# Patient Record
Sex: Male | Born: 2000 | Race: Black or African American | Hispanic: No | Marital: Single | Smoking: Never smoker
Health system: Southern US, Community
[De-identification: ages and names within clinical notes are randomized; demographics above are authoritative.]

---
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
## 2019-10-19 ENCOUNTER — Ambulatory Visit: Payer: Medicaid Other | Attending: Family

## 2019-10-19 DIAGNOSIS — Z23 Encounter for immunization: Secondary | ICD-10-CM

## 2019-10-19 NOTE — Progress Notes (Signed)
   Covid-19 Vaccination Clinic  Name:  Ian Beasley    MRN: [id number] DOB: 2001/06/01  10/19/2019  [name] was observed post Covid-19 immunization for 15 minutes without incident. He was provided with Vaccine Information Sheet and instruction to access the V-Safe system.   [name] was instructed to call 911 with any severe reactions post vaccine: Marland Kitchen Difficulty breathing  . Swelling of face and throat  . A fast heartbeat  . A bad rash all over body  . Dizziness and weakness   Immunizations Administered    Name Date Dose VIS Date Route   Pfizer COVID-19 Vaccine 10/19/2019  1:24 PM 0.3 mL 07/26/2018 Intramuscular   Manufacturer: [employer], [employer]   Lot: [id number]   NDC: [address]-4

## 2020-08-13 ENCOUNTER — Encounter (HOSPITAL_COMMUNITY): Payer: Self-pay | Admitting: *Deleted

## 2020-08-13 ENCOUNTER — Ambulatory Visit (HOSPITAL_COMMUNITY)
Admission: EM | Admit: 2020-08-13 | Discharge: 2020-08-13 | Disposition: A | Discharge status: Home / Self Care | Payer: 59 | Attending: Urgent Care | Admitting: Urgent Care

## 2020-08-13 ENCOUNTER — Other Ambulatory Visit: Payer: Self-pay

## 2020-08-13 DIAGNOSIS — Z20822 Contact with and (suspected) exposure to covid-19: Secondary | ICD-10-CM | POA: Insufficient documentation

## 2020-08-13 DIAGNOSIS — R112 Nausea with vomiting, unspecified: Secondary | ICD-10-CM | POA: Diagnosis present

## 2020-08-13 DIAGNOSIS — R197 Diarrhea, unspecified: Secondary | ICD-10-CM

## 2020-08-13 DIAGNOSIS — K529 Noninfective gastroenteritis and colitis, unspecified: Secondary | ICD-10-CM

## 2020-08-13 LAB — SARS CORONAVIRUS 2 (TAT 6-24 HRS): SARS Coronavirus 2: NEGATIVE

## 2020-08-13 MED ORDER — LOPERAMIDE HCL 2 MG PO CAPS: 2.0000 mg | ORAL_CAPSULE | Freq: Two times a day (BID) | ORAL | 0 refills | Status: DC | PRN

## 2020-08-13 MED ORDER — ONDANSETRON 8 MG PO TBDP: 8.0000 mg | ORAL_TABLET | Freq: Three times a day (TID) | ORAL | 0 refills | Status: DC | PRN

## 2020-08-13 NOTE — Discharge Instructions (Signed)

## 2020-08-13 NOTE — ED Provider Notes (Signed)
  [facility]   MRN: [id number] DOB: 10-07-2000  Subjective:   Jolly Bleicher is a 20 y.o. male presenting for 4-day history of acute onset nausea, vomiting, diarrhea, generalized belly pain.  The vomiting has subsided but he still has 3-4 loose stools a day.  Denies fever, cough, chest pain, shortness of breath, bloody stools.  No recent hospitalizations, antibiotic use or long distance travel.  Patient is kept dietary routine the same.  No history of GI issues or family history of the same.  He has used Pepto-Bismol with minimal relief.  No current facility-administered medications for this encounter. No current outpatient medications on file.   No Known Allergies  History reviewed. No pertinent past medical history.   History reviewed. No pertinent surgical history.  History reviewed. No pertinent family history.  Social History   Tobacco Use  . Smoking status: Never Smoker  . Smokeless tobacco: Never Used    ROS   Objective:   Vitals: BP (!) 143/73 (BP Location: Right Arm)   Pulse (!) 50   Temp 98.1 F (36.7 C) (Oral)   Resp 18   SpO2 100%   Physical Exam Constitutional:      General: He is not in acute distress.    Appearance: Normal appearance. He is well-developed. He is not ill-appearing, toxic-appearing or diaphoretic.  HENT:     Head: Normocephalic and atraumatic.     Right Ear: External ear normal.     Left Ear: External ear normal.     Nose: Nose normal.     Mouth/Throat:     Mouth: Mucous membranes are moist.     Pharynx: Oropharynx is clear.  Eyes:     General: No scleral icterus.    Extraocular Movements: Extraocular movements intact.     Pupils: Pupils are equal, round, and reactive to light.  Cardiovascular:     Rate and Rhythm: Normal rate and regular rhythm.     Heart sounds: Normal heart sounds. No murmur heard. No friction rub. No gallop.   Pulmonary:     Effort: Pulmonary effort is normal. No respiratory distress.      Breath sounds: Normal breath sounds. No stridor. No wheezing, rhonchi or rales.  Abdominal:     General: Bowel sounds are normal. There is no distension.     Palpations: Abdomen is soft. There is no mass.     Tenderness: There is generalized abdominal tenderness (mild). There is no guarding or rebound.  Skin:    General: Skin is warm and dry.  Neurological:     Mental Status: He is alert and oriented to person, place, and time.  Psychiatric:        Mood and Affect: Mood normal.        Behavior: Behavior normal.        Thought Content: Thought content normal.     Assessment and Plan :   PDMP not reviewed this encounter.  1. Gastroenteritis   2. Nausea vomiting and diarrhea     Will manage for suspected viral gastroenteritis with supportive care.  Recommended patient hydrate well, eat light meals and maintain electrolytes.  Will use Zofran and Imodium for nausea, vomiting and diarrhea. Counseled patient on potential for adverse effects with medications prescribed/recommended today, ER and return-to-clinic precautions discussed, patient verbalized understanding.    Wallis Bamberg, [place] 08/13/20 1405

## 2020-08-13 NOTE — ED Triage Notes (Signed)
Pt reports a onset of ABD pain ,N/V and diarrhea that started SAt night. Pt reports no improvement and is not eating as normal.

## 2020-09-24 IMAGING — CR DG ANKLE COMPLETE 3+V*L*
3 series · 3 of 3 positions shown · non-contrast
Comparison: None.

CLINICAL DATA: Soccer injury with persistent pain

EXAM:
LEFT ANKLE COMPLETE - 3+ VIEW

[x ankle ap left]
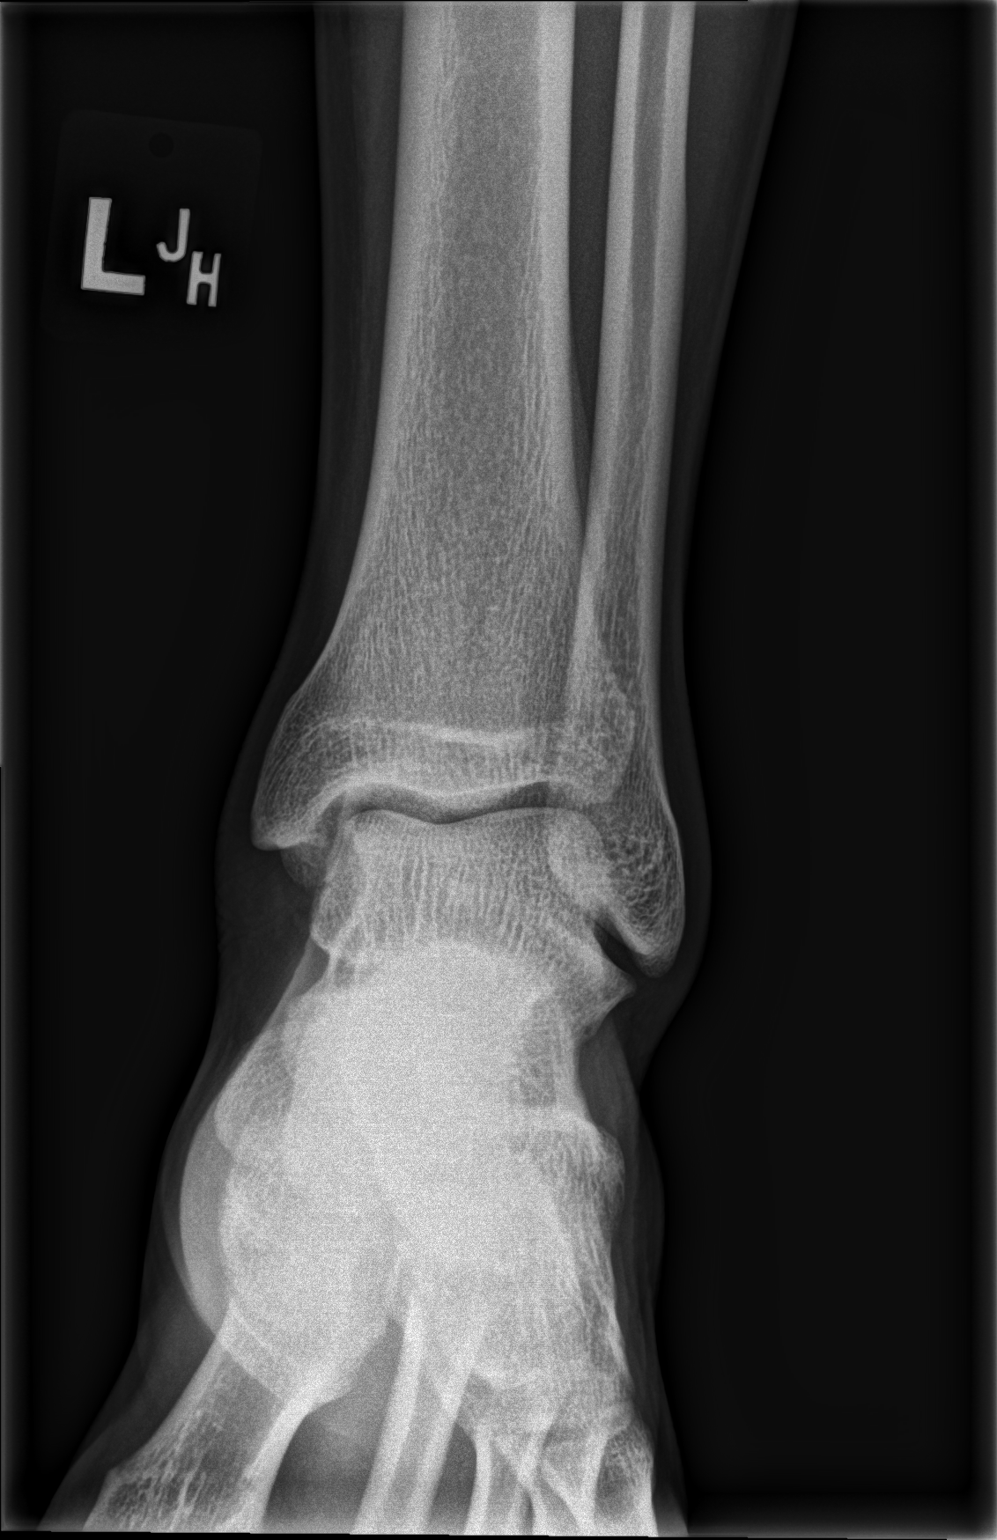

[x ankle obl left]
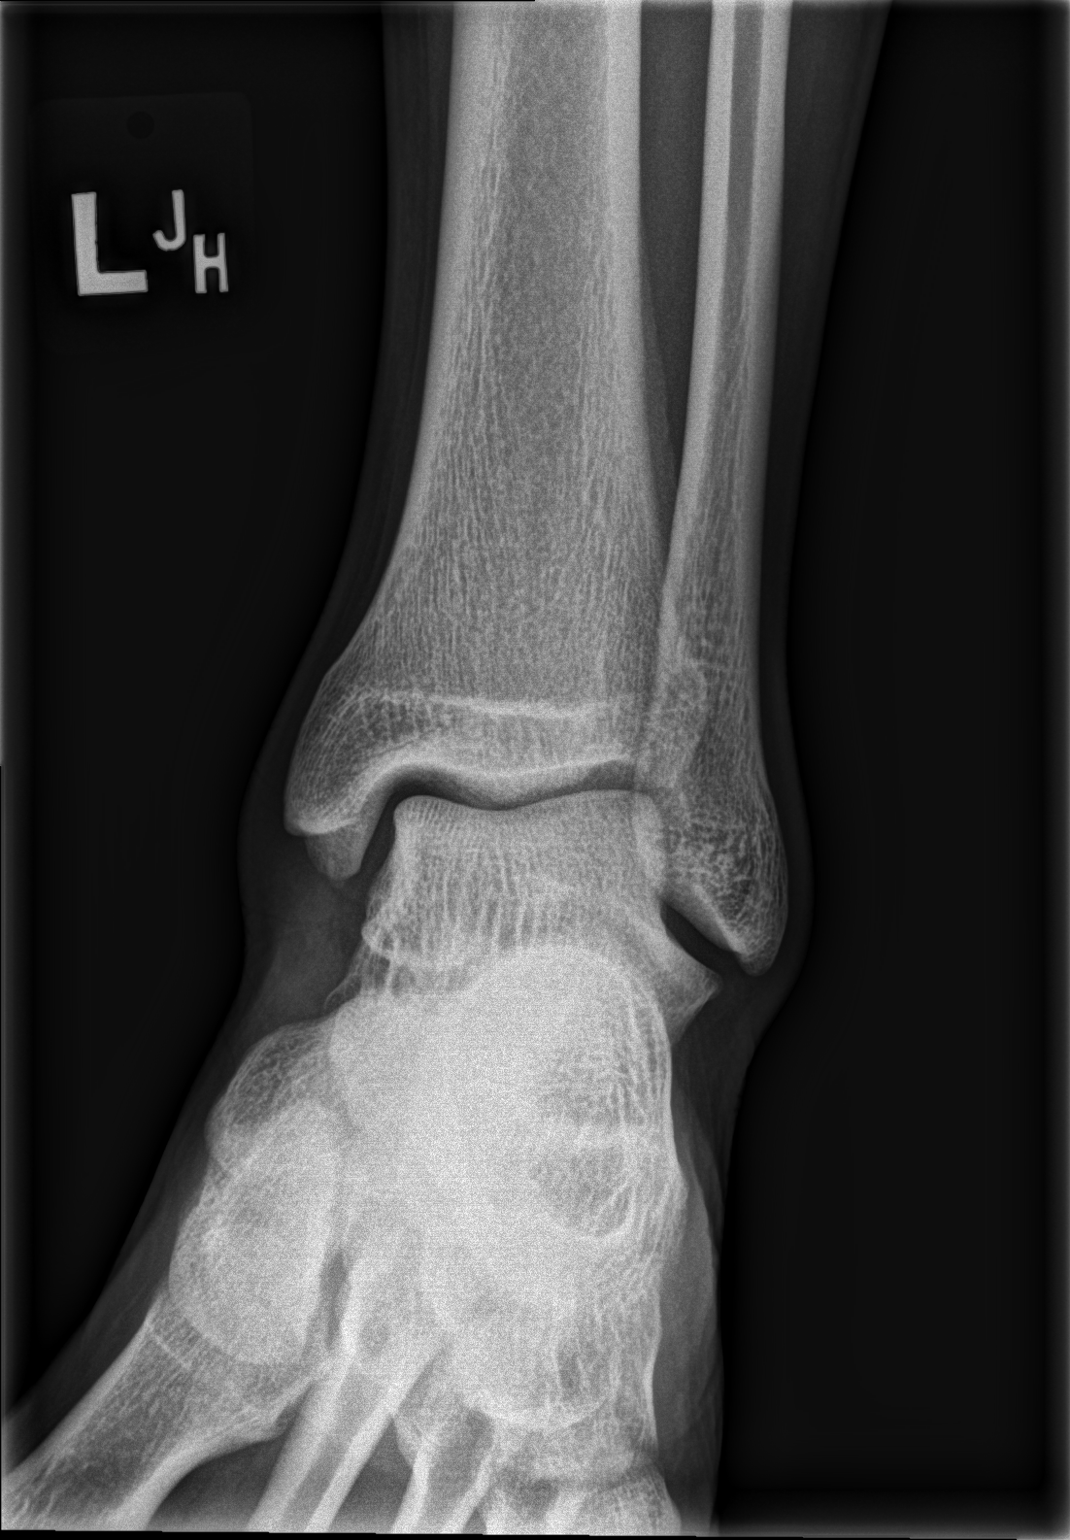

[x ankle lat left]
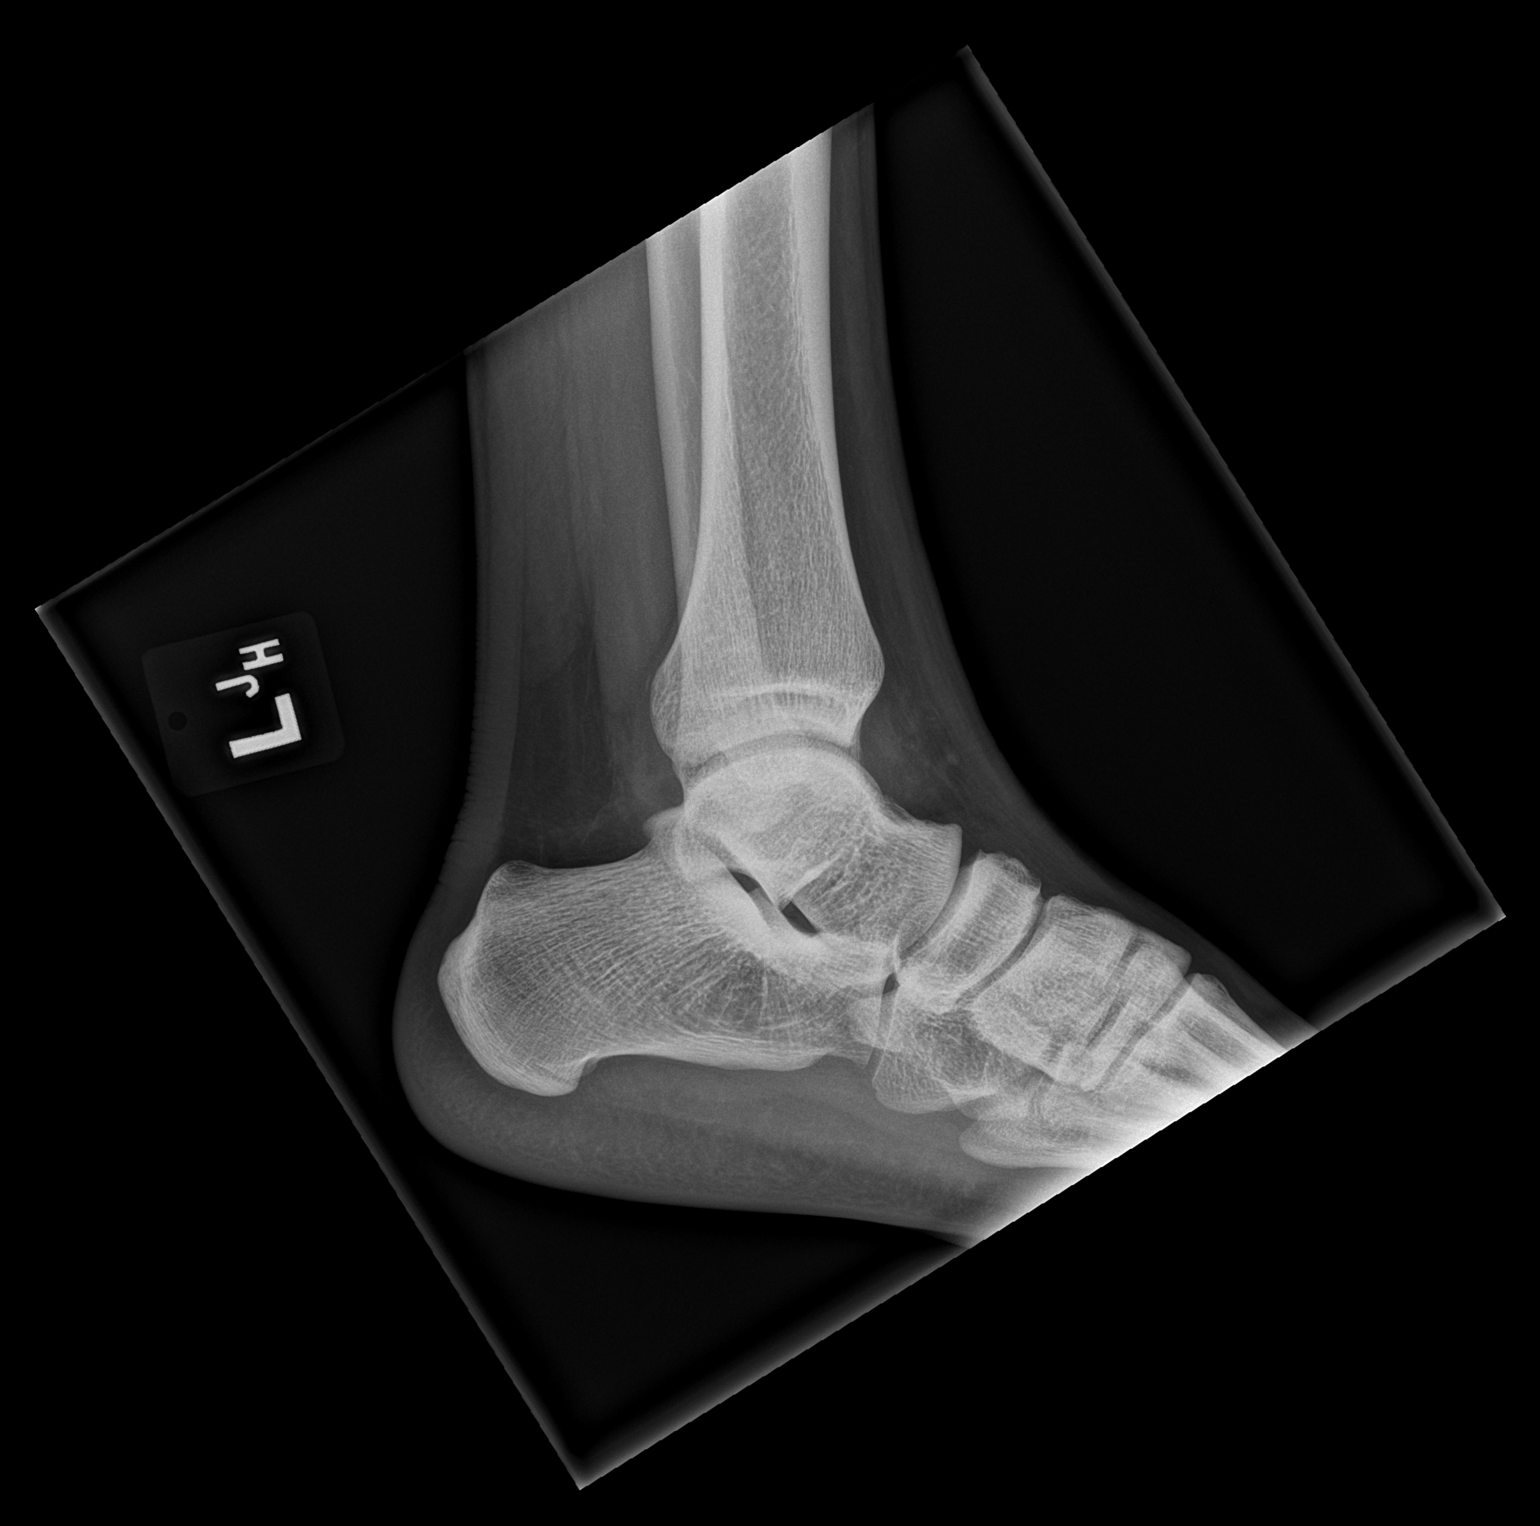

[3 of 3 positions shown; findings below may reference images not displayed]

FINDINGS: There is no evidence of fracture, dislocation, or joint effusion.
There is no evidence of arthropathy or other focal bone abnormality.
Soft tissues are unremarkable.
IMPRESSION: Negative.

## 2024-01-09 ENCOUNTER — Ambulatory Visit (HOSPITAL_COMMUNITY)
Admission: EM | Admit: 2024-01-09 | Discharge: 2024-01-09 | Disposition: A | Discharge status: Home / Self Care | Attending: Physician Assistant | Admitting: Physician Assistant

## 2024-01-09 ENCOUNTER — Ambulatory Visit (HOSPITAL_COMMUNITY)

## 2024-01-09 ENCOUNTER — Encounter (HOSPITAL_COMMUNITY): Payer: Self-pay

## 2024-01-09 ENCOUNTER — Ambulatory Visit (INDEPENDENT_AMBULATORY_CARE_PROVIDER_SITE_OTHER)

## 2024-01-09 DIAGNOSIS — M25462 Effusion, left knee: Secondary | ICD-10-CM | POA: Diagnosis not present

## 2024-01-09 DIAGNOSIS — M25571 Pain in right ankle and joints of right foot: Secondary | ICD-10-CM

## 2024-01-09 DIAGNOSIS — M25562 Pain in left knee: Secondary | ICD-10-CM

## 2024-01-09 DIAGNOSIS — S93401A Sprain of unspecified ligament of right ankle, initial encounter: Secondary | ICD-10-CM | POA: Diagnosis not present

## 2024-01-09 DIAGNOSIS — S8392XA Sprain of unspecified site of left knee, initial encounter: Secondary | ICD-10-CM

## 2024-01-09 MED ORDER — CELECOXIB 100 MG PO CAPS: 100.0000 mg | ORAL_CAPSULE | Freq: Two times a day (BID) | ORAL | 0 refills | Status: AC

## 2024-01-09 NOTE — ED Triage Notes (Signed)
 Pt states that he fell and injured is left knee. X1 day

## 2024-01-09 NOTE — ED Provider Notes (Signed)
 [facility]    CSN: [id number] Arrival date & time: 01/09/24  1025      History   Chief Complaint Chief Complaint  Patient presents with   Knee Injury    HPI Ian Beasley is a 23 y.o. male.   Patient presents today for evaluation of left ankle and right knee pain.  He reports that last week he rolled his left ankle several times and had significant pain and swelling.  He is sprained his ankle in the past and assumed that it would resolve with conservative treatment measures including RICE protocol but has continued to have pain prompting evaluation.  He reports that this pain is minimal but worse with ambulation.  He has never had any surgery involving his ankle.  While he would like to have this evaluated his primary concern is severe right knee pain.  Reports that yesterday when he was playing soccer he fell in such a way that there was significant varus stress on the knee.  He was initially able to ambulate after the injury but has had worsening pain since then and is having difficulty today.  He reports minimal pain at rest but it is rated 9 with attempted ambulation, generalized throughout the knee, no alleviating factors identified.  Denies previous injury or surgery involving his knee.  He has not tried any over-the-counter medication for symptom management but has been using a brace with minimal improvement.    History reviewed. No pertinent past medical history.  There are no active problems to display for this patient.   History reviewed. No pertinent surgical history.     Home Medications    Prior to Admission medications   Medication Sig Start Date End Date Taking? Authorizing Provider  celecoxib  (CELEBREX ) 100 MG capsule Take 1 capsule (100 mg total) by mouth 2 (two) times daily. 01/09/24  Yes Isola Mehlman, Rocky POUR, PA-C    Family History History reviewed. No pertinent family history.  Social History Social History   Tobacco Use   Smoking status: Never    Smokeless tobacco: Never  Vaping Use   Vaping status: Never Used  Substance Use Topics   Alcohol use: Never   Drug use: Never     Allergies   Patient has no known allergies.   Review of Systems Review of Systems  Constitutional:  Positive for activity change. Negative for appetite change, fatigue and fever.  Musculoskeletal:  Positive for arthralgias, gait problem, joint swelling and myalgias.  Skin:  Negative for color change and wound.  Neurological:  Negative for weakness and numbness.     Physical Exam Triage Vital Signs ED Triage Vitals  Encounter Vitals Group     BP 01/09/24 1040 130/77     Girls Systolic BP Percentile --      Girls Diastolic BP Percentile --      Boys Systolic BP Percentile --      Boys Diastolic BP Percentile --      Pulse Rate 01/09/24 1040 (!) 58     Resp 01/09/24 1040 19     Temp 01/09/24 1040 98.5 F (36.9 C)     Temp Source 01/09/24 1040 Oral     SpO2 01/09/24 1040 97 %     Weight 01/09/24 1038 230 lb (104.3 kg)     Height 01/09/24 1038 6' 4 (1.93 m)     Head Circumference --      Peak Flow --      Pain Score 01/09/24 1038 9  Pain Loc --      Pain Education --      Exclude from Growth Chart --    No data found.  Updated Vital Signs BP 130/77 (BP Location: Left Arm)   Pulse (!) 58   Temp 98.5 F (36.9 C) (Oral)   Resp 19   Ht 6' 4 (1.93 m)   Wt 230 lb (104.3 kg)   SpO2 97%   BMI 28.00 kg/m   Visual Acuity Right Eye Distance:   Left Eye Distance:   Bilateral Distance:    Right Eye Near:   Left Eye Near:    Bilateral Near:     Physical Exam Vitals reviewed.  Constitutional:      General: He is awake.     Appearance: Normal appearance. He is well-developed. He is not ill-appearing.     Comments: Very pleasant male appears stated age in no acute distress sitting comfortably in exam room  HENT:     Head: Normocephalic and atraumatic.  Cardiovascular:     Rate and Rhythm: Normal rate and regular rhythm.      Heart sounds: Normal heart sounds, S1 normal and S2 normal. No murmur heard.    Comments: Capillary refill within 2 seconds bilateral toes Pulmonary:     Effort: Pulmonary effort is normal.     Breath sounds: Normal breath sounds. No stridor. No wheezing, rhonchi or rales.     Comments: Clear to auscultation bilaterally Musculoskeletal:     Right knee: Swelling and effusion present. Decreased range of motion. Tenderness present over the medial joint line and lateral joint line.     Left ankle: Swelling present. Tenderness present over the lateral malleolus. Normal range of motion.     Comments: Right knee: Significant swelling and moderate effusion with tenderness over inferior joint line worse over  medial tibial plateau.  No ligamentous laxity on exam though exam is significantly limited due to pain and swelling.  Foot is neurovascularly intact.  Left ankle: Tenderness palpation over lateral malleolus without deformity.  Normal active range of motion with inversion, eversion, plantar and dorsiflexion.  Foot is neurovascularly intact.  Neurological:     Mental Status: He is alert.  Psychiatric:        Behavior: Behavior is cooperative.      UC Treatments / Results  Labs (all labs ordered are listed, but only abnormal results are displayed) Labs Reviewed - No data to display  EKG   Radiology DG Ankle Complete Right Result Date: 01/09/2024 CLINICAL DATA:  Pain after injury from soccer game EXAM: RIGHT ANKLE - COMPLETE 3+ VIEW COMPARISON:  None Available. FINDINGS: No acute fracture or dislocation. Swelling over the lateral malleolus. IMPRESSION: No acute fracture or dislocation. Electronically Signed   By: Norman Gatlin M.D.   On: 01/09/2024 11:30   DG Knee Complete 4 Views Left Result Date: 01/09/2024 CLINICAL DATA:  Injury during a soccer game.  Pain. EXAM: LEFT KNEE - COMPLETE 4+ VIEW COMPARISON:  None Available. FINDINGS: No evidence of acute fracture or dislocation. Small knee  joint effusion. Soft tissues are unremarkable. IMPRESSION: No acute fracture or dislocation. Small knee joint effusion. Electronically Signed   By: Norman Gatlin M.D.   On: 01/09/2024 11:30    Procedures Procedures (including critical care time)  Medications Ordered in UC Medications - No data to display  Initial Impression / Assessment and Plan / UC Course  I have reviewed the triage vital signs and the nursing notes.  Pertinent labs &  imaging results that were available during my care of the patient were reviewed by me and considered in my medical decision making (see chart for details).     Patient is well-appearing, afebrile, nontoxic, nontachycardic.  X-ray of right ankle was obtained based on [place] ankle rules that showed no acute osseous abnormality we discussed that this is likely a sprain.  Recommended RICE protocol to help manage his symptoms that he declined brace.  X-ray of knee showed no evidence of fracture or dislocation but I am very concerned about a ligamentous injury.  Unable to identify based on physical exam as patient has significant swelling and was unable to tolerate the exam.  He was placed in the immobilizer and given crutches with instruction not to bear weight until he is seen by orthopedic provider.  He is visiting family in this area but lives in [place] and so was given the contact information for a clinic in his home area.  He was started on Celebrex  to help with pain and inflammation.  Discussed that he is not to take additional NSAIDs with this medicine.  If he has any worsening or changing symptoms he is to be seen immediately.  All questions were answered to patient satisfaction.  Final Clinical Impressions(s) / UC Diagnoses   Final diagnoses:  Acute pain of left knee  Acute right ankle pain  Sprain of right ankle, unspecified ligament, initial encounter  Effusion, left knee  Sprain of left knee, unspecified ligament, initial encounter      Discharge Instructions      Your x-rays were normal with no evidence of broken or out of place bones.  This makes me more concerned about an injury to one of the ligaments in your knee and likely a sprain of your ankle.  Keep with your ankle and your knee elevated and apply ice for 15 minutes at a time 3-4 times a day.  Use the knee immobilizer and crutches until you see the orthopedic doctor.  They should be able to do additional testing to help ensure that we are not missing something.  Call them to schedule an appointment as soon as possible.  Take Celebrex  for pain relief.  Do not take NSAIDs with this medication including aspirin, ibuprofen/Advil, naproxen/Aleve.  Use Tylenol/acetaminophen for additional pain relief.  If anything worsens or changes and you have swelling of your leg, numbness or tingling in your toes, discoloration of your leg you need to be seen immediately.     ED Prescriptions     Medication Sig Dispense Auth. Provider   celecoxib  (CELEBREX ) 100 MG capsule Take 1 capsule (100 mg total) by mouth 2 (two) times daily. 20 capsule Kemari Narez K, PA-C      PDMP not reviewed this encounter.   Sherrell Rocky POUR, PA-C 01/09/24 1202

## 2024-01-09 NOTE — Discharge Instructions (Addendum)
 Your x-rays were normal with no evidence of broken or out of place bones.  This makes me more concerned about an injury to one of the ligaments in your knee and likely a sprain of your ankle.  Keep with your ankle and your knee elevated and apply ice for 15 minutes at a time 3-4 times a day.  Use the knee immobilizer and crutches until you see the orthopedic doctor.  They should be able to do additional testing to help ensure that we are not missing something.  Call them to schedule an appointment as soon as possible.  Take Celebrex  for pain relief.  Do not take NSAIDs with this medication including aspirin, ibuprofen/Advil, naproxen/Aleve.  Use Tylenol/acetaminophen for additional pain relief.  If anything worsens or changes and you have swelling of your leg, numbness or tingling in your toes, discoloration of your leg you need to be seen immediately.

## 2024-01-10 DIAGNOSIS — M25462 Effusion, left knee: Secondary | ICD-10-CM | POA: Diagnosis not present

## 2024-01-10 DIAGNOSIS — M25562 Pain in left knee: Secondary | ICD-10-CM | POA: Diagnosis not present

## 2024-01-24 DIAGNOSIS — M25562 Pain in left knee: Secondary | ICD-10-CM | POA: Diagnosis not present

## 2024-01-24 DIAGNOSIS — S83282A Other tear of lateral meniscus, current injury, left knee, initial encounter: Secondary | ICD-10-CM | POA: Diagnosis not present

## 2024-01-24 DIAGNOSIS — S83412A Sprain of medial collateral ligament of left knee, initial encounter: Secondary | ICD-10-CM | POA: Diagnosis not present

## 2024-01-24 DIAGNOSIS — M25462 Effusion, left knee: Secondary | ICD-10-CM | POA: Diagnosis not present

## 2024-01-24 DIAGNOSIS — S83512A Sprain of anterior cruciate ligament of left knee, initial encounter: Secondary | ICD-10-CM | POA: Diagnosis not present

## 2024-02-07 DIAGNOSIS — S83282A Other tear of lateral meniscus, current injury, left knee, initial encounter: Secondary | ICD-10-CM | POA: Diagnosis not present

## 2024-02-07 DIAGNOSIS — S83512A Sprain of anterior cruciate ligament of left knee, initial encounter: Secondary | ICD-10-CM | POA: Diagnosis not present

## 2024-02-24 DIAGNOSIS — S83512A Sprain of anterior cruciate ligament of left knee, initial encounter: Secondary | ICD-10-CM | POA: Diagnosis not present

## 2024-02-24 DIAGNOSIS — G8918 Other acute postprocedural pain: Secondary | ICD-10-CM | POA: Diagnosis not present

## 2024-02-24 DIAGNOSIS — S83272A Complex tear of lateral meniscus, current injury, left knee, initial encounter: Secondary | ICD-10-CM | POA: Diagnosis not present

## 2024-03-01 DIAGNOSIS — Z4889 Encounter for other specified surgical aftercare: Secondary | ICD-10-CM | POA: Diagnosis not present

## 2024-05-03 DIAGNOSIS — J069 Acute upper respiratory infection, unspecified: Secondary | ICD-10-CM | POA: Diagnosis not present

## 2024-05-03 DIAGNOSIS — R0981 Nasal congestion: Secondary | ICD-10-CM | POA: Diagnosis not present

## 2024-05-23 ENCOUNTER — Telehealth: Payer: Self-pay

## 2024-05-23 ENCOUNTER — Ambulatory Visit: Admitting: Family Medicine

## 2024-05-23 NOTE — Telephone Encounter (Signed)
 Copied from CRM #[id number]. Topic: General - Running Late >> May 23, 2024 10:58 AM Charlet HERO wrote: Patient/patient representative is calling because they are running late for an appointment.  Will be 10 mins late

## 2024-05-26 DIAGNOSIS — H5213 Myopia, bilateral: Secondary | ICD-10-CM | POA: Diagnosis not present
